# Patient Record
Sex: Male | Born: 1975 | Race: White | Hispanic: No | Marital: Married | Smoking: Never smoker
Health system: Southern US, Community
[De-identification: ages and names within clinical notes are randomized; demographics above are authoritative.]

## PROBLEM LIST (undated history)

## (undated) DIAGNOSIS — I10 Essential (primary) hypertension: Secondary | ICD-10-CM

## (undated) DIAGNOSIS — Z9189 Other specified personal risk factors, not elsewhere classified: Secondary | ICD-10-CM

## (undated) DIAGNOSIS — E785 Hyperlipidemia, unspecified: Secondary | ICD-10-CM

## (undated) DIAGNOSIS — N201 Calculus of ureter: Secondary | ICD-10-CM

## (undated) HISTORY — PX: WISDOM TOOTH EXTRACTION: SHX21

---

## 1999-01-24 ENCOUNTER — Ambulatory Visit (HOSPITAL_COMMUNITY): Admission: RE | Admit: 1999-01-24 | Discharge: 1999-01-24 | Payer: Self-pay | Admitting: Family Medicine

## 1999-01-24 ENCOUNTER — Encounter: Payer: Self-pay | Admitting: Family Medicine

## 1999-10-05 ENCOUNTER — Ambulatory Visit (HOSPITAL_COMMUNITY): Admission: RE | Admit: 1999-10-05 | Discharge: 1999-10-05 | Payer: Self-pay

## 2010-06-02 ENCOUNTER — Other Ambulatory Visit: Payer: Self-pay | Admitting: Family Medicine

## 2010-06-02 DIAGNOSIS — Q248 Other specified congenital malformations of heart: Secondary | ICD-10-CM

## 2010-06-02 DIAGNOSIS — R05 Cough: Secondary | ICD-10-CM

## 2010-06-08 ENCOUNTER — Ambulatory Visit
Admission: RE | Admit: 2010-06-08 | Discharge: 2010-06-08 | Disposition: A | Discharge status: Home / Self Care | Payer: 59 | Source: Ambulatory Visit | Attending: Family Medicine | Admitting: Family Medicine

## 2010-06-08 DIAGNOSIS — Q248 Other specified congenital malformations of heart: Secondary | ICD-10-CM

## 2010-06-08 DIAGNOSIS — R05 Cough: Secondary | ICD-10-CM

## 2010-06-08 MED ORDER — IOHEXOL 300 MG/ML  SOLN
75.0000 mL | Freq: Once | INTRAMUSCULAR | Status: AC | PRN
  Administered 2010-06-08: 75 mL via INTRAVENOUS

## 2014-12-08 ENCOUNTER — Encounter (HOSPITAL_BASED_OUTPATIENT_CLINIC_OR_DEPARTMENT_OTHER): Payer: Self-pay

## 2014-12-08 ENCOUNTER — Emergency Department (HOSPITAL_BASED_OUTPATIENT_CLINIC_OR_DEPARTMENT_OTHER)
Admission: EM | Admit: 2014-12-08 | Discharge: 2014-12-09 | Disposition: A | Discharge status: Home / Self Care | Payer: 59 | Attending: Emergency Medicine | Admitting: Emergency Medicine

## 2014-12-08 ENCOUNTER — Emergency Department (HOSPITAL_BASED_OUTPATIENT_CLINIC_OR_DEPARTMENT_OTHER): Payer: 59

## 2014-12-08 DIAGNOSIS — R109 Unspecified abdominal pain: Secondary | ICD-10-CM

## 2014-12-08 DIAGNOSIS — I10 Essential (primary) hypertension: Secondary | ICD-10-CM | POA: Insufficient documentation

## 2014-12-08 DIAGNOSIS — E7800 Pure hypercholesterolemia, unspecified: Secondary | ICD-10-CM | POA: Insufficient documentation

## 2014-12-08 DIAGNOSIS — Z79899 Other long term (current) drug therapy: Secondary | ICD-10-CM | POA: Diagnosis not present

## 2014-12-08 DIAGNOSIS — N23 Unspecified renal colic: Secondary | ICD-10-CM

## 2014-12-08 HISTORY — DX: Essential (primary) hypertension: I10

## 2014-12-08 LAB — URINALYSIS, ROUTINE W REFLEX MICROSCOPIC
Bilirubin Urine: NEGATIVE
Glucose, UA: NEGATIVE mg/dL
Ketones, ur: NEGATIVE mg/dL
Leukocytes, UA: NEGATIVE
Nitrite: NEGATIVE
Protein, ur: NEGATIVE mg/dL
Specific Gravity, Urine: 1.021 (ref 1.005–1.030)
pH: 7.5 (ref 5.0–8.0)

## 2014-12-08 LAB — URINE MICROSCOPIC-ADD ON

## 2014-12-08 MED ORDER — SODIUM CHLORIDE 0.9 % IV SOLN
INTRAVENOUS | Status: DC
  Administered 2014-12-08: 23:00:00 via INTRAVENOUS

## 2014-12-08 MED ORDER — HYDROMORPHONE HCL 1 MG/ML IJ SOLN
1.0000 mg | Freq: Once | INTRAMUSCULAR | Status: AC
  Administered 2014-12-09: 1 mg via INTRAVENOUS
  Filled 2014-12-08: qty 1

## 2014-12-08 MED ORDER — ONDANSETRON HCL 4 MG/2ML IJ SOLN
4.0000 mg | Freq: Once | INTRAMUSCULAR | Status: AC
  Administered 2014-12-08: 4 mg via INTRAVENOUS
  Filled 2014-12-08: qty 2

## 2014-12-08 NOTE — ED Notes (Signed)
MD at bedside. 

## 2014-12-08 NOTE — ED Notes (Signed)
Right flank pan x 2 days

## 2014-12-08 NOTE — ED Provider Notes (Signed)
CSN: [id number]     Arrival date & time 12/08/14  2249 History  By signing my name below, I, Soijett Blue, attest that this documentation has been prepared under the direction and in the presence of Paula Libra, MD. Electronically Signed: Soijett Blue, ED Scribe. 12/08/2014. 11:17 PM.   Chief Complaint  Patient presents with  . Flank Pain      The history is provided by the patient. No language interpreter was used.    HPI Comments: Devin Horne is a 39 y.o. male with a medical hx of HTN who presents to the Emergency Department complaining of 7/10, waxing and waning, right flank pain x 2 days. He notes that his right flank pain radiates to his RLQ. He denies having a kidney stone in the past. He is having associated symptoms of hematuria and nausea. Pt was seen for his hematuria several months ago and was informed that he didn't have a urinary tract infection and was referred to a urologist whom he has yet to follow up with. He states that he has not tried any medications for the relief for his symptoms. He denies vomiting. He has not had a fever or chills.   Past Medical History  Diagnosis Date  . Hypertension   . High cholesterol    History reviewed. No pertinent past surgical history. No family history on file. Social History  Substance Use Topics  . Smoking status: Never Smoker   . Smokeless tobacco: None  . Alcohol Use: No    Review of Systems  Genitourinary: Positive for flank pain.    A complete 10 system review of systems was obtained and all systems are negative except as noted in the HPI and PMH.   Allergies  Codeine  Home Medications   Prior to Admission medications   Medication Sig Start Date End Date Taking? Authorizing Provider  losartan (COZAAR) 100 MG tablet Take 100 mg by mouth daily.   Yes Historical Provider, MD  metoprolol succinate (TOPROL-XL) 50 MG 24 hr tablet Take 50 mg by mouth daily. Take with or immediately following a meal.   Yes Historical  Provider, MD  rosuvastatin (CRESTOR) 20 MG tablet Take 20 mg by mouth daily.   Yes Historical Provider, MD   BP 175/114 mmHg  Pulse 72  Temp(Src) 98.9 F (37.2 C) (Oral)  Resp 20  Ht  (1.676 m)  Wt 286 lb (129.729 kg)  BMI 46.18 kg/m2  SpO2 99%   Physical Exam General: Well-developed, well-nourished male in no acute distress; appearance consistent with age of record HENT: normocephalic; atraumatic Eyes: pupils equal, round and reactive to light; extraocular muscles intact Neck: supple Heart: regular rate and rhythm Lungs: clear to auscultation bilaterally Abdomen: soft; nondistended; nontender; no masses or hepatosplenomegaly; bowel sounds present GU: No CVA tenderness Extremities: No deformity; full range of motion; pulses normal Neurologic: Awake, alert and oriented; motor function intact in all extremities and symmetric; no facial droop Skin: Warm and dry Psychiatric: Normal mood and affect    ED Course  Procedures (including critical care time) DIAGNOSTIC STUDIES: Oxygen Saturation is 99% on RA, nl by my interpretation.    COORDINATION OF CARE: 11:17 PM Discussed treatment plan with pt at bedside which includes UA, CT renal stone, dilaudid and zofran and pt agreed to plan.   MDM   Nursing notes and vitals signs, including pulse oximetry, reviewed.  Summary of this visit's results, reviewed by myself:  Labs:  Results for orders placed or performed  during the hospital encounter of 12/08/14 (from the past 24 hour(s))  Urinalysis, Routine w reflex microscopic (not at [facility])     Status: Abnormal   Collection Time: 12/08/14 11:00 PM  Result Value Ref Range   Color, Urine YELLOW YELLOW   APPearance CLEAR CLEAR   Specific Gravity, Urine 1.021 1.005 - 1.030   pH 7.5 5.0 - 8.0   Glucose, UA NEGATIVE NEGATIVE mg/dL   Hgb urine dipstick LARGE (A) NEGATIVE   Bilirubin Urine NEGATIVE NEGATIVE   Ketones, ur NEGATIVE NEGATIVE mg/dL   Protein, ur NEGATIVE NEGATIVE  mg/dL   Nitrite NEGATIVE NEGATIVE   Leukocytes, UA NEGATIVE NEGATIVE  Urine microscopic-add on     Status: Abnormal   Collection Time: 12/08/14 11:00 PM  Result Value Ref Range   Squamous Epithelial / LPF 0-5 (A) NONE SEEN   WBC, UA 0-5 0 - 5 WBC/hpf   RBC / HPF TOO NUMEROUS TO COUNT 0 - 5 RBC/hpf   Bacteria, UA RARE (A) NONE SEEN    Imaging Studies: Ct Renal Stone Study  12/09/2014  CLINICAL DATA:  Right flank pain, intermittent for 2 weeks and severe for the past 2 days. Hematuria. EXAM: CT ABDOMEN AND PELVIS WITHOUT CONTRAST TECHNIQUE: Multidetector CT imaging of the abdomen and pelvis was performed following the standard protocol without IV contrast. COMPARISON:  None. FINDINGS: There is an obstructing 5 mm calculus in the proximal right ureter at the low L3 level. There is moderate hydroureter and hydronephrosis. No other urinary calculi are evident. There is no other acute abnormality in the abdomen or pelvis. There are unremarkable unenhanced appearances of the liver, gallbladder, pancreas, spleen and adrenals. The abdominal aorta is normal in caliber. There is no atherosclerotic calcification. There is no adenopathy in the abdomen or pelvis. There are normal appearances of the stomach, small bowel and colon. The appendix is normal. No significant abnormality is evident in the lower chest. No significant musculoskeletal lesion is evident. Small fat containing umbilical hernia is present. IMPRESSION: 1. Obstructing 5 mm proximal right ureteral calculus at the low L3 level. Moderate hydronephrosis. 2. Small fat containing umbilical hernia. Electronically Signed   By: Ellery Plunkaniel R Mitchell M.D.   On: 12/09/2014 00:07   12:44 AM Patient ready to go. Patient advised of CT findings.  I personally performed the services described in this documentation, which was scribed in my presence. The recorded information has been reviewed and is accurate.   Paula LibraJohn Monigue Spraggins, MD 12/09/14 [phone]

## 2014-12-09 MED ORDER — ONDANSETRON HCL 4 MG/2ML IJ SOLN
INTRAMUSCULAR | Status: AC
  Filled 2014-12-09: qty 2

## 2014-12-09 MED ORDER — ONDANSETRON 8 MG PO TBDP: 8.0000 mg | ORAL_TABLET | Freq: Three times a day (TID) | ORAL | Status: DC | PRN

## 2014-12-09 MED ORDER — HYDROMORPHONE HCL 1 MG/ML IJ SOLN: 1.0000 mg | Freq: Once | INTRAMUSCULAR | Status: DC

## 2014-12-09 MED ORDER — OXYCODONE-ACETAMINOPHEN 5-325 MG PO TABS: 1.0000 | ORAL_TABLET | ORAL | Status: DC | PRN

## 2014-12-09 MED ORDER — ONDANSETRON HCL 4 MG/2ML IJ SOLN
4.0000 mg | Freq: Once | INTRAMUSCULAR | Status: AC
  Administered 2014-12-09: 4 mg via INTRAVENOUS

## 2014-12-09 NOTE — Discharge Instructions (Signed)
Flank Pain °Flank pain refers to pain that is located on the side of the body between the upper abdomen and the back. The pain may occur over a short period of time (acute) or may be long-term or reoccurring (chronic). It may be mild or severe. Flank pain can be caused by many things. °CAUSES  °Some of the more common causes of flank pain include: °· Muscle strains.   °· Muscle spasms.   °· A disease of your spine (vertebral disk disease).   °· A lung infection (pneumonia).   °· Fluid around your lungs (pulmonary edema).   °· A kidney infection.   °· Kidney stones.   °· A very painful skin rash caused by the chickenpox virus (shingles).   °· Gallbladder disease.   °HOME CARE INSTRUCTIONS  °Home care will depend on the cause of your pain. In general, °· Rest as directed by your caregiver. °· Drink enough fluids to keep your urine clear or pale yellow. °· Only take over-the-counter or prescription medicines as directed by your caregiver. Some medicines may help relieve the pain. °· Tell your caregiver about any changes in your pain. °· Follow up with your caregiver as directed. °SEEK IMMEDIATE MEDICAL CARE IF:  °· Your pain is not controlled with medicine.   °· You have new or worsening symptoms. °· Your pain increases.   °· You have abdominal pain.   °· You have shortness of breath.   °· You have persistent nausea or vomiting.   °· You have swelling in your abdomen.   °· You feel faint or pass out.   °· You have blood in your urine. °· You have a fever or persistent symptoms for more than 2-3 days. °· You have a fever and your symptoms suddenly get worse. °MAKE SURE YOU:  °· Understand these instructions. °· Will watch your condition. °· Will get help right away if you are not doing well or get worse. °  °This information is not intended to replace advice given to you by your health care provider. Make sure you discuss any questions you have with your health care provider. °  °Document Released: 02/15/2005 Document  Revised: 09/19/2011 Document Reviewed: 08/09/2011 °Elsevier Interactive Patient Education ©2016 Elsevier Inc. ° °

## 2014-12-14 ENCOUNTER — Emergency Department (HOSPITAL_COMMUNITY)
Admission: EM | Admit: 2014-12-14 | Discharge: 2014-12-15 | Disposition: A | Discharge status: Home / Self Care | Payer: 59 | Attending: Emergency Medicine | Admitting: Emergency Medicine

## 2014-12-14 ENCOUNTER — Encounter (HOSPITAL_COMMUNITY): Payer: Self-pay | Admitting: Emergency Medicine

## 2014-12-14 DIAGNOSIS — E7800 Pure hypercholesterolemia, unspecified: Secondary | ICD-10-CM | POA: Diagnosis not present

## 2014-12-14 DIAGNOSIS — N200 Calculus of kidney: Secondary | ICD-10-CM

## 2014-12-14 DIAGNOSIS — I10 Essential (primary) hypertension: Secondary | ICD-10-CM | POA: Insufficient documentation

## 2014-12-14 DIAGNOSIS — R1030 Lower abdominal pain, unspecified: Secondary | ICD-10-CM | POA: Diagnosis present

## 2014-12-14 DIAGNOSIS — Z79899 Other long term (current) drug therapy: Secondary | ICD-10-CM | POA: Insufficient documentation

## 2014-12-14 LAB — URINE MICROSCOPIC-ADD ON

## 2014-12-14 LAB — CBC WITH DIFFERENTIAL/PLATELET
BASOS ABS: 0 10*3/uL (ref 0.0–0.1)
BASOS PCT: 0 %
EOS PCT: 1 %
Eosinophils Absolute: 0.1 10*3/uL (ref 0.0–0.7)
HEMATOCRIT: 42.4 % (ref 39.0–52.0)
Hemoglobin: 14.1 g/dL (ref 13.0–17.0)
LYMPHS ABS: 1.3 10*3/uL (ref 0.7–4.0)
LYMPHS PCT: 12 %
MCH: 32.1 pg (ref 26.0–34.0)
MCHC: 33.3 g/dL (ref 30.0–36.0)
MCV: 96.6 fL (ref 78.0–100.0)
Monocytes Absolute: 0.8 10*3/uL (ref 0.1–1.0)
Monocytes Relative: 7 %
Neutro Abs: 8.9 10*3/uL — ABNORMAL HIGH (ref 1.7–7.7)
Neutrophils Relative %: 80 %
PLATELETS: 275 10*3/uL (ref 150–400)
RBC: 4.39 MIL/uL (ref 4.22–5.81)
RDW: 12.4 % (ref 11.5–15.5)
WBC: 11.2 10*3/uL — ABNORMAL HIGH (ref 4.0–10.5)

## 2014-12-14 LAB — URINALYSIS, ROUTINE W REFLEX MICROSCOPIC
Bilirubin Urine: NEGATIVE
GLUCOSE, UA: NEGATIVE mg/dL
KETONES UR: NEGATIVE mg/dL
LEUKOCYTES UA: NEGATIVE
NITRITE: NEGATIVE
PROTEIN: NEGATIVE mg/dL
Specific Gravity, Urine: 1.026 (ref 1.005–1.030)
pH: 5.5 (ref 5.0–8.0)

## 2014-12-14 LAB — BASIC METABOLIC PANEL
ANION GAP: 10 (ref 5–15)
BUN: 19 mg/dL (ref 6–20)
CO2: 24 mmol/L (ref 22–32)
Calcium: 9.3 mg/dL (ref 8.9–10.3)
Chloride: 102 mmol/L (ref 101–111)
Creatinine, Ser: 1.32 mg/dL — ABNORMAL HIGH (ref 0.61–1.24)
GLUCOSE: 138 mg/dL — AB (ref 65–99)
POTASSIUM: 4.3 mmol/L (ref 3.5–5.1)
Sodium: 136 mmol/L (ref 135–145)

## 2014-12-14 MED ORDER — KETOROLAC TROMETHAMINE 30 MG/ML IJ SOLN
30.0000 mg | Freq: Once | INTRAMUSCULAR | Status: AC
  Administered 2014-12-15: 30 mg via INTRAVENOUS
  Filled 2014-12-14: qty 1

## 2014-12-14 MED ORDER — ONDANSETRON HCL 4 MG/2ML IJ SOLN
4.0000 mg | Freq: Once | INTRAMUSCULAR | Status: AC
  Administered 2014-12-15: 4 mg via INTRAVENOUS
  Filled 2014-12-14: qty 2

## 2014-12-14 MED ORDER — FENTANYL CITRATE (PF) 100 MCG/2ML IJ SOLN: 50.0000 ug | Freq: Once | INTRAMUSCULAR | Status: DC

## 2014-12-14 MED ORDER — ONDANSETRON HCL 4 MG/2ML IJ SOLN
4.0000 mg | Freq: Once | INTRAMUSCULAR | Status: AC
  Administered 2014-12-14: 4 mg via INTRAVENOUS
  Filled 2014-12-14: qty 2

## 2014-12-14 MED ORDER — METOCLOPRAMIDE HCL 5 MG/ML IJ SOLN
10.0000 mg | Freq: Once | INTRAMUSCULAR | Status: AC
  Administered 2014-12-14: 10 mg via INTRAVENOUS
  Filled 2014-12-14: qty 2

## 2014-12-14 MED ORDER — MORPHINE SULFATE (PF) 4 MG/ML IV SOLN
8.0000 mg | Freq: Once | INTRAVENOUS | Status: AC
  Administered 2014-12-14: 8 mg via INTRAVENOUS
  Filled 2014-12-14: qty 2

## 2014-12-14 MED ORDER — FENTANYL CITRATE (PF) 100 MCG/2ML IJ SOLN
50.0000 ug | Freq: Once | INTRAMUSCULAR | Status: AC
Start: 2014-12-14 — End: 2014-12-14
  Administered 2014-12-14: 50 ug via INTRAVENOUS
  Filled 2014-12-14: qty 2

## 2014-12-14 MED ORDER — FENTANYL CITRATE (PF) 100 MCG/2ML IJ SOLN
50.0000 ug | Freq: Once | INTRAMUSCULAR | Status: AC
  Administered 2014-12-14: 50 ug via INTRAVENOUS

## 2014-12-14 NOTE — ED Notes (Signed)
[facility] and [facility] with [facility] kidney stone. Stone has not passed. Was seen at Urology yesterday. Says pain medications (Percocet-allergic to codeine so has been vomiting with medication) have not alleviated pain. Says urine output is "a little diminished." Was also given Zofran 8 mg with no alleviation of nausea. Given Flomax by urologist. Devin Horne first dose today after pain had started-denies alleviation with pain. Says pain is unbearable. No other c/c.

## 2014-12-15 NOTE — ED Notes (Signed)
Known kidney stone, saw Urology MD recently and was sent here for uncontrolled pain and vomiting.

## 2014-12-15 NOTE — ED Provider Notes (Signed)
CSN: [id number]     Arrival date & time 12/14/14  2010 History   First MD Initiated Contact with Patient 12/14/14 2049     No chief complaint on file.    (Consider location/radiation/quality/duration/timing/severity/associated sxs/prior Treatment) Patient is a 39 y.o. male presenting with flank pain. The history is provided by the patient.  Flank Pain This is a recurrent problem. The current episode started 12 to 24 hours ago. The problem occurs constantly. The problem has been gradually worsening. Pertinent negatives include no abdominal pain. Nothing aggravates the symptoms. Nothing relieves the symptoms. Treatments tried: percocet. The treatment provided no relief.    Past Medical History  Diagnosis Date  . Hypertension   . High cholesterol    History reviewed. No pertinent past surgical history. History reviewed. No pertinent family history. Social History  Substance Use Topics  . Smoking status: Never Smoker   . Smokeless tobacco: None  . Alcohol Use: No    Review of Systems  Gastrointestinal: Positive for nausea and vomiting. Negative for abdominal pain.  Genitourinary: Positive for flank pain.  All other systems reviewed and are negative.     Allergies  Codeine; Dilaudid; Percocet; and Avelox  Home Medications   Prior to Admission medications   Medication Sig Start Date End Date Taking? Authorizing Provider  acetaminophen (TYLENOL) 500 MG tablet Take 1,000 mg by mouth every 6 (six) hours as needed for moderate pain.   Yes Historical Provider, MD  losartan (COZAAR) 100 MG tablet Take 100 mg by mouth daily.   Yes Historical Provider, MD  Magnesium Salicylate (DOANS PILLS PO) Take 2 tablets by mouth every 6 (six) hours as needed (pain).   Yes Historical Provider, MD  metoprolol succinate (TOPROL-XL) 50 MG 24 hr tablet Take 50 mg by mouth daily. Take with or immediately following a meal.   Yes Historical Provider, MD  ondansetron (ZOFRAN ODT) 8 MG disintegrating  tablet Take 1 tablet (8 mg total) by mouth every 8 (eight) hours as needed for nausea or vomiting. 12/09/14  Yes John Molpus, MD  oxyCODONE-acetaminophen (PERCOCET) 5-325 MG tablet Take 1-2 tablets by mouth every 4 (four) hours as needed (for pain). 12/09/14  Yes John Molpus, MD  rosuvastatin (CRESTOR) 20 MG tablet Take 20 mg by mouth daily.   Yes Historical Provider, MD  tamsulosin (FLOMAX) 0.4 MG CAPS capsule Take 0.4 mg by mouth daily. 12/14/14  Yes Historical Provider, MD  VENTOLIN HFA 108 (90 BASE) MCG/ACT inhaler Inhale 2 puffs into the lungs every 4 (four) hours. 11/18/14  Yes Historical Provider, MD   BP 154/76 mmHg  Pulse 79  Temp(Src) 97.7 F (36.5 C) (Oral)  Resp 20  SpO2 93% Physical Exam  Constitutional: He is oriented to person, place, and time. He appears well-developed and well-nourished. No distress.  HENT:  Head: Normocephalic and atraumatic.  Eyes: Conjunctivae are normal.  Neck: Neck supple. No tracheal deviation present.  Cardiovascular: Normal rate and regular rhythm.   Pulmonary/Chest: Effort normal. No respiratory distress.  Abdominal: Soft. He exhibits no distension. There is no tenderness. There is CVA tenderness (on right).  Neurological: He is alert and oriented to person, place, and time.  Skin: Skin is warm and dry.  Psychiatric: He has a normal mood and affect.    ED Course  Procedures (including critical care time)  Emergency Focused Ultrasound Exam Limited Retroperitoneal Ultrasound of Kidneys  Performed and interpreted by Dr. Clydene PughKnott Focused abdominal ultrasound with both kidneys imaged in transverse and longitudinal planes in  real-time. Indication: flank pain Findings: only right kidney visualized, no shadowing, + anechoic areas Interpretation: mild right hydronephrosis visualized.  no stones or cysts visualized  Examination severely limited secondary to patient body habitus Images archived electronically  CPT Code: 16109   Labs Review Labs  Reviewed  CBC WITH DIFFERENTIAL/PLATELET - Abnormal; Notable for the following:    WBC 11.2 (*)    Neutro Abs 8.9 (*)    All other components within normal limits  BASIC METABOLIC PANEL - Abnormal; Notable for the following:    Glucose, Bld 138 (*)    Creatinine, Ser 1.32 (*)    All other components within normal limits  URINALYSIS, ROUTINE W REFLEX MICROSCOPIC (NOT AT [facility]) - Abnormal; Notable for the following:    Color, Urine AMBER (*)    APPearance CLOUDY (*)    Hgb urine dipstick LARGE (*)    All other components within normal limits  URINE MICROSCOPIC-ADD ON - Abnormal; Notable for the following:    Squamous Epithelial / LPF 0-5 (*)    Bacteria, UA FEW (*)    All other components within normal limits  URINE CULTURE    Imaging Review No results found. I have personally reviewed and evaluated these images and lab results as part of my medical decision-making.   EKG Interpretation None      MDM   Final diagnoses:  Kidney stone on right side    39 year old male presents with known right-sided 5 mm stone that is obstructing. He had [facility] 6 days ago, has follow-up with urology and at that time had improvement in his pain. Today pain worsened acutely and patient was having ongoing side effects of his Percocet medication. He was given morphine with some relief of his symptoms on arrival. His screening lab work shows no evidence of acute infection, his creatinine is only mildly elevated at 1.32 and he has a unilateral obstruction. Mild persistent hydronephrosis is noted on today's exam but this is limited due to habitus.   After multiple dose of narcotic agents and anti-emetics as well as toradol Pt's pain is adequately controled. Agrees to contact urology in the morning for reassessment as symptoms have increased and stone has not yet passed. Return precautions discussed for new signs of infection, inability to tolerate po, or uncontrolled  pain.    Lyndal Pulley, MD 12/15/14 [phone]

## 2014-12-15 NOTE — Discharge Instructions (Signed)
Kidney Stones °Kidney stones (urolithiasis) are deposits that form inside your kidneys. The intense pain is caused by the stone moving through the urinary tract. When the stone moves, the ureter goes into spasm around the stone. The stone is usually passed in the urine.  °CAUSES  °· A disorder that makes certain neck glands produce too much parathyroid hormone (primary hyperparathyroidism). °· A buildup of uric acid crystals, similar to gout in your joints. °· Narrowing (stricture) of the ureter. °· A kidney obstruction present at birth (congenital obstruction). °· Previous surgery on the kidney or ureters. °· Numerous kidney infections. °SYMPTOMS  °· Feeling sick to your stomach (nauseous). °· Throwing up (vomiting). °· Blood in the urine (hematuria). °· Pain that usually spreads (radiates) to the groin. °· Frequency or urgency of urination. °DIAGNOSIS  °· Taking a history and physical exam. °· Blood or urine tests. °· CT scan. °· Occasionally, an examination of the inside of the urinary bladder (cystoscopy) is performed. °TREATMENT  °· Observation. °· Increasing your fluid intake. °· Extracorporeal shock wave lithotripsy--This is a noninvasive procedure that uses shock waves to break up kidney stones. °· Surgery may be needed if you have severe pain or persistent obstruction. There are various surgical procedures. Most of the procedures are performed with the use of small instruments. Only small incisions are needed to accommodate these instruments, so recovery time is minimized. °The size, location, and chemical composition are all important variables that will determine the proper choice of action for you. Talk to your health care provider to better understand your situation so that you will minimize the risk of injury to yourself and your kidney.  °HOME CARE INSTRUCTIONS  °· Drink enough water and fluids to keep your urine clear or pale yellow. This will help you to pass the stone or stone fragments. °· Strain  all urine through the provided strainer. Keep all particulate matter and stones for your health care provider to see. The stone causing the pain may be as small as a grain of salt. It is very important to use the strainer each and every time you pass your urine. The collection of your stone will allow your health care provider to analyze it and verify that a stone has actually passed. The stone analysis will often identify what you can do to reduce the incidence of recurrences. °· Only take over-the-counter or prescription medicines for pain, discomfort, or fever as directed by your health care provider. °· Keep all follow-up visits as told by your health care provider. This is important. °· Get follow-up X-rays if required. The absence of pain does not always mean that the stone has passed. It may have only stopped moving. If the urine remains completely obstructed, it can cause loss of kidney function or even complete destruction of the kidney. It is your responsibility to make sure X-rays and follow-ups are completed. Ultrasounds of the kidney can show blockages and the status of the kidney. Ultrasounds are not associated with any radiation and can be performed easily in a matter of minutes. °· Make changes to your daily diet as told by your health care provider. You may be told to: °¨ Limit the amount of salt that you eat. °¨ Eat 5 or more servings of fruits and vegetables each day. °¨ Limit the amount of meat, poultry, fish, and eggs that you eat. °· Collect a 24-hour urine sample as told by your health care provider. You may need to collect another urine sample every 6-12   months. °SEEK MEDICAL CARE IF: °· You experience pain that is progressive and unresponsive to any pain medicine you have been prescribed. °SEEK IMMEDIATE MEDICAL CARE IF:  °· Pain cannot be controlled with the prescribed medicine. °· You have a fever or shaking chills. °· The severity or intensity of pain increases over 18 hours and is not  relieved by pain medicine. °· You develop a new onset of abdominal pain. °· You feel faint or pass out. °· You are unable to urinate. °  °This information is not intended to replace advice given to you by your health care provider. Make sure you discuss any questions you have with your health care provider. °  °Document Released: 12/25/2004 Document Revised: 09/15/2014 Document Reviewed: 05/28/2012 °Elsevier Interactive Patient Education ©2016 Elsevier Inc. ° °

## 2014-12-16 LAB — URINE CULTURE: Culture: NO GROWTH

## 2015-03-09 ENCOUNTER — Other Ambulatory Visit: Payer: Self-pay | Admitting: Urology

## 2015-03-17 ENCOUNTER — Encounter (HOSPITAL_BASED_OUTPATIENT_CLINIC_OR_DEPARTMENT_OTHER): Payer: Self-pay | Admitting: *Deleted

## 2015-03-17 NOTE — Progress Notes (Signed)
   03/17/15 1430  OBSTRUCTIVE SLEEP APNEA  Have you ever been diagnosed with sleep apnea through a sleep study? No  Do you snore loudly (loud enough to be heard through closed doors)?  1  Do you often feel tired, fatigued, or sleepy during the daytime (such as falling asleep during driving or talking to someone)? 0  Has anyone observed you stop breathing during your sleep? 0  Do you have, or are you being treated for high blood pressure? 1  BMI more than 35 kg/m2? 1  Age > 50 (1-yes) 0  Neck circumference greater than:Male 16 inches or larger, Male 17inches or larger? 1  Male Gender (Yes=1) 1  Obstructive Sleep Apnea Score 5  Score 5 or greater  Results sent to PCP

## 2015-03-17 NOTE — Progress Notes (Signed)
NPO AFTER MN.  ARRIVE AT 0930.  NEEDS ISTAT AND EKG.  WILL TAKE AM MEDS AND ZANTAC DOS W/ SIPS OF WATER.

## 2015-03-23 ENCOUNTER — Ambulatory Visit (HOSPITAL_BASED_OUTPATIENT_CLINIC_OR_DEPARTMENT_OTHER)
Admission: RE | Admit: 2015-03-23 | Discharge: 2015-03-23 | Disposition: A | Discharge status: Home / Self Care | Payer: BLUE CROSS/BLUE SHIELD | Source: Ambulatory Visit | Attending: Urology | Admitting: Urology

## 2015-03-23 ENCOUNTER — Encounter (HOSPITAL_BASED_OUTPATIENT_CLINIC_OR_DEPARTMENT_OTHER)
Admission: RE | Disposition: A | Discharge status: Home / Self Care | Payer: Self-pay | Source: Ambulatory Visit | Attending: Urology

## 2015-03-23 ENCOUNTER — Ambulatory Visit (HOSPITAL_BASED_OUTPATIENT_CLINIC_OR_DEPARTMENT_OTHER): Payer: BLUE CROSS/BLUE SHIELD | Admitting: Anesthesiology

## 2015-03-23 ENCOUNTER — Other Ambulatory Visit: Payer: Self-pay

## 2015-03-23 ENCOUNTER — Encounter (HOSPITAL_BASED_OUTPATIENT_CLINIC_OR_DEPARTMENT_OTHER): Payer: Self-pay | Admitting: Anesthesiology

## 2015-03-23 DIAGNOSIS — I10 Essential (primary) hypertension: Secondary | ICD-10-CM | POA: Diagnosis not present

## 2015-03-23 DIAGNOSIS — Z79899 Other long term (current) drug therapy: Secondary | ICD-10-CM | POA: Diagnosis not present

## 2015-03-23 DIAGNOSIS — Z6841 Body Mass Index (BMI) 40.0 and over, adult: Secondary | ICD-10-CM | POA: Insufficient documentation

## 2015-03-23 DIAGNOSIS — N201 Calculus of ureter: Secondary | ICD-10-CM | POA: Diagnosis present

## 2015-03-23 HISTORY — DX: Calculus of ureter: N20.1

## 2015-03-23 HISTORY — PX: CYSTOSCOPY/RETROGRADE/URETEROSCOPY/STONE EXTRACTION WITH BASKET: SHX5317

## 2015-03-23 HISTORY — PX: HOLMIUM LASER APPLICATION: SHX5852

## 2015-03-23 HISTORY — DX: Other specified personal risk factors, not elsewhere classified: Z91.89

## 2015-03-23 HISTORY — PX: CYSTOSCOPY WITH STENT PLACEMENT: SHX5790

## 2015-03-23 HISTORY — DX: Hyperlipidemia, unspecified: E78.5

## 2015-03-23 LAB — POCT I-STAT, CHEM 8
BUN: 16 mg/dL (ref 6–20)
CHLORIDE: 109 mmol/L (ref 101–111)
Calcium, Ion: 1.14 mmol/L (ref 1.12–1.23)
Creatinine, Ser: 1.1 mg/dL (ref 0.61–1.24)
Glucose, Bld: 100 mg/dL — ABNORMAL HIGH (ref 65–99)
HEMATOCRIT: 45 % (ref 39.0–52.0)
HEMOGLOBIN: 15.3 g/dL (ref 13.0–17.0)
POTASSIUM: 4.5 mmol/L (ref 3.5–5.1)
SODIUM: 142 mmol/L (ref 135–145)
TCO2: 21 mmol/L (ref 0–100)

## 2015-03-23 SURGERY — CYSTOSCOPY, WITH CALCULUS REMOVAL USING BASKET
Anesthesia: General | Laterality: Right

## 2015-03-23 MED ORDER — FENTANYL CITRATE (PF) 100 MCG/2ML IJ SOLN
INTRAMUSCULAR | Status: AC
  Filled 2015-03-23: qty 2

## 2015-03-23 MED ORDER — URELLE 81 MG PO TABS
1.0000 | ORAL_TABLET | Freq: Four times a day (QID) | ORAL | Status: DC
  Filled 2015-03-23: qty 1

## 2015-03-23 MED ORDER — EPHEDRINE SULFATE 50 MG/ML IJ SOLN
INTRAMUSCULAR | Status: DC | PRN
  Administered 2015-03-23: 10 mg via INTRAVENOUS
  Administered 2015-03-23: 15 mg via INTRAVENOUS
  Administered 2015-03-23: 10 mg via INTRAVENOUS
  Administered 2015-03-23: 15 mg via INTRAVENOUS

## 2015-03-23 MED ORDER — LACTATED RINGERS IV SOLN
INTRAVENOUS | Status: DC
  Administered 2015-03-23 (×2): via INTRAVENOUS
  Filled 2015-03-23: qty 1000

## 2015-03-23 MED ORDER — OXYCODONE HCL 5 MG/5ML PO SOLN
5.0000 mg | Freq: Once | ORAL | Status: DC | PRN
  Filled 2015-03-23: qty 5

## 2015-03-23 MED ORDER — KETOROLAC TROMETHAMINE 30 MG/ML IJ SOLN
INTRAMUSCULAR | Status: AC
  Filled 2015-03-23: qty 1

## 2015-03-23 MED ORDER — DEXAMETHASONE SODIUM PHOSPHATE 4 MG/ML IJ SOLN
INTRAMUSCULAR | Status: DC | PRN
  Administered 2015-03-23: 10 mg via INTRAVENOUS

## 2015-03-23 MED ORDER — CEFAZOLIN SODIUM-DEXTROSE 2-3 GM-% IV SOLR
INTRAVENOUS | Status: AC
  Filled 2015-03-23: qty 50

## 2015-03-23 MED ORDER — FENTANYL CITRATE (PF) 100 MCG/2ML IJ SOLN
INTRAMUSCULAR | Status: DC | PRN
  Administered 2015-03-23 (×2): 50 ug via INTRAVENOUS

## 2015-03-23 MED ORDER — EPHEDRINE SULFATE 50 MG/ML IJ SOLN
INTRAMUSCULAR | Status: AC
  Filled 2015-03-23: qty 1

## 2015-03-23 MED ORDER — PROPOFOL 10 MG/ML IV BOLUS
INTRAVENOUS | Status: DC | PRN
  Administered 2015-03-23: 300 mg via INTRAVENOUS
  Administered 2015-03-23: 100 mg via INTRAVENOUS

## 2015-03-23 MED ORDER — DEXAMETHASONE SODIUM PHOSPHATE 10 MG/ML IJ SOLN
INTRAMUSCULAR | Status: AC
  Filled 2015-03-23: qty 1

## 2015-03-23 MED ORDER — METOCLOPRAMIDE HCL 5 MG/ML IJ SOLN
INTRAMUSCULAR | Status: AC
  Filled 2015-03-23: qty 2

## 2015-03-23 MED ORDER — HYDROMORPHONE HCL 1 MG/ML IJ SOLN
0.2500 mg | INTRAMUSCULAR | Status: DC | PRN
  Filled 2015-03-23: qty 1

## 2015-03-23 MED ORDER — PHENYLEPHRINE HCL 10 MG/ML IJ SOLN
INTRAMUSCULAR | Status: DC | PRN
  Administered 2015-03-23: 80 ug via INTRAVENOUS
  Administered 2015-03-23: 40 ug via INTRAVENOUS
  Administered 2015-03-23: 80 ug via INTRAVENOUS

## 2015-03-23 MED ORDER — HYDROCODONE-ACETAMINOPHEN 5-325 MG PO TABS: 1.0000 | ORAL_TABLET | Freq: Four times a day (QID) | ORAL | Status: AC | PRN

## 2015-03-23 MED ORDER — MEPERIDINE HCL 25 MG/ML IJ SOLN
6.2500 mg | INTRAMUSCULAR | Status: DC | PRN
  Filled 2015-03-23: qty 1

## 2015-03-23 MED ORDER — LIDOCAINE HCL (CARDIAC) 20 MG/ML IV SOLN
INTRAVENOUS | Status: DC | PRN
  Administered 2015-03-23: 100 mg via INTRAVENOUS

## 2015-03-23 MED ORDER — MIDAZOLAM HCL 5 MG/5ML IJ SOLN
INTRAMUSCULAR | Status: DC | PRN
  Administered 2015-03-23: 2 mg via INTRAVENOUS

## 2015-03-23 MED ORDER — IOHEXOL 350 MG/ML SOLN
INTRAVENOUS | Status: DC | PRN
  Administered 2015-03-23: 1 mL via URETHRAL

## 2015-03-23 MED ORDER — HYDROCODONE-ACETAMINOPHEN 5-325 MG PO TABS
ORAL_TABLET | ORAL | Status: AC
  Filled 2015-03-23: qty 2

## 2015-03-23 MED ORDER — URIBEL 118 MG PO CAPS: 1.0000 | ORAL_CAPSULE | Freq: Three times a day (TID) | ORAL | Status: AC | PRN

## 2015-03-23 MED ORDER — ONDANSETRON HCL 4 MG/2ML IJ SOLN
INTRAMUSCULAR | Status: DC | PRN
  Administered 2015-03-23: 4 mg via INTRAVENOUS

## 2015-03-23 MED ORDER — PROPOFOL 10 MG/ML IV BOLUS
INTRAVENOUS | Status: AC
  Filled 2015-03-23: qty 20

## 2015-03-23 MED ORDER — DEXTROSE 5 % IV SOLN
3.0000 g | INTRAVENOUS | Status: AC
  Administered 2015-03-23: 3 g via INTRAVENOUS
  Filled 2015-03-23: qty 3000

## 2015-03-23 MED ORDER — MIDAZOLAM HCL 2 MG/2ML IJ SOLN
INTRAMUSCULAR | Status: AC
  Filled 2015-03-23: qty 2

## 2015-03-23 MED ORDER — HYDROCODONE-ACETAMINOPHEN 5-325 MG PO TABS
2.0000 | ORAL_TABLET | Freq: Four times a day (QID) | ORAL | Status: DC | PRN
  Administered 2015-03-23: 2 via ORAL
  Filled 2015-03-23: qty 2

## 2015-03-23 MED ORDER — ONDANSETRON HCL 4 MG/2ML IJ SOLN
INTRAMUSCULAR | Status: AC
  Filled 2015-03-23: qty 2

## 2015-03-23 MED ORDER — SODIUM CHLORIDE 0.9 % IR SOLN
Status: DC | PRN
  Administered 2015-03-23 (×3): 1000 mL via INTRAVESICAL

## 2015-03-23 MED ORDER — URELLE 81 MG PO TABS
ORAL_TABLET | ORAL | Status: AC
  Filled 2015-03-23: qty 1

## 2015-03-23 MED ORDER — PHENYLEPHRINE 40 MCG/ML (10ML) SYRINGE FOR IV PUSH (FOR BLOOD PRESSURE SUPPORT)
PREFILLED_SYRINGE | INTRAVENOUS | Status: AC
  Filled 2015-03-23: qty 10

## 2015-03-23 MED ORDER — CEFAZOLIN SODIUM 1-5 GM-% IV SOLN
1.0000 g | INTRAVENOUS | Status: DC
  Filled 2015-03-23: qty 50

## 2015-03-23 MED ORDER — SCOPOLAMINE 1 MG/3DAYS TD PT72
1.0000 | MEDICATED_PATCH | TRANSDERMAL | Status: DC
  Administered 2015-03-23: 1.5 mg via TRANSDERMAL
  Filled 2015-03-23: qty 1

## 2015-03-23 MED ORDER — METOCLOPRAMIDE HCL 5 MG/ML IJ SOLN
INTRAMUSCULAR | Status: DC | PRN
  Administered 2015-03-23: 10 mg via INTRAVENOUS

## 2015-03-23 MED ORDER — SCOPOLAMINE 1 MG/3DAYS TD PT72
MEDICATED_PATCH | TRANSDERMAL | Status: AC
  Filled 2015-03-23: qty 1

## 2015-03-23 MED ORDER — CEFAZOLIN SODIUM 1-5 GM-% IV SOLN
INTRAVENOUS | Status: AC
  Filled 2015-03-23: qty 50

## 2015-03-23 MED ORDER — KETOROLAC TROMETHAMINE 30 MG/ML IJ SOLN
INTRAMUSCULAR | Status: DC | PRN
  Administered 2015-03-23: 30 mg via INTRAVENOUS

## 2015-03-23 MED ORDER — LIDOCAINE HCL (CARDIAC) 20 MG/ML IV SOLN
INTRAVENOUS | Status: AC
  Filled 2015-03-23: qty 5

## 2015-03-23 MED ORDER — OXYCODONE HCL 5 MG PO TABS
5.0000 mg | ORAL_TABLET | Freq: Once | ORAL | Status: DC | PRN
  Filled 2015-03-23: qty 1

## 2015-03-23 SURGICAL SUPPLY — 42 items
ADAPTER CATH URET PLST 4-6FR (CATHETERS) IMPLANT
ADPR CATH URET STRL DISP 4-6FR (CATHETERS)
APL SKNCLS STERI-STRIP NONHPOA (GAUZE/BANDAGES/DRESSINGS)
BAG DRAIN URO-CYSTO SKYTR STRL (DRAIN) ×3 IMPLANT
BAG DRN UROCATH (DRAIN) ×1
BASKET DAKOTA 1.9FR 11X120 (BASKET) ×3 IMPLANT
BASKET LASER NITINOL 1.9FR (BASKET) IMPLANT
BASKET STNLS GEMINI 4WIRE 3FR (BASKET) IMPLANT
BASKET ZERO TIP NITINOL 2.4FR (BASKET) IMPLANT
BENZOIN TINCTURE PRP APPL 2/3 (GAUZE/BANDAGES/DRESSINGS) IMPLANT
BSKT STON RTRVL 120 1.9FR (BASKET)
BSKT STON RTRVL GEM 120X11 3FR (BASKET)
BSKT STON RTRVL ZERO TP 2.4FR (BASKET)
CATH INTERMIT  6FR 70CM (CATHETERS) IMPLANT
CATH URET 5FR 28IN CONE TIP (BALLOONS)
CATH URET 5FR 28IN OPEN ENDED (CATHETERS) ×3 IMPLANT
CATH URET 5FR 70CM CONE TIP (BALLOONS) IMPLANT
CLOTH BEACON ORANGE TIMEOUT ST (SAFETY) ×3 IMPLANT
DRSG TEGADERM 2-3/8X2-3/4 SM (GAUZE/BANDAGES/DRESSINGS) IMPLANT
FIBER LASER FLEXIVA 365 (UROLOGICAL SUPPLIES) ×3 IMPLANT
FIBER LASER TRAC TIP (UROLOGICAL SUPPLIES) IMPLANT
GLOVE BIO SURGEON STRL SZ7.5 (GLOVE) ×3 IMPLANT
GOWN STRL REUS W/ TWL XL LVL3 (GOWN DISPOSABLE) ×1 IMPLANT
GOWN STRL REUS W/TWL XL LVL3 (GOWN DISPOSABLE) ×3
GUIDEWIRE 0.038 PTFE COATED (WIRE) IMPLANT
GUIDEWIRE ANG ZIPWIRE 038X150 (WIRE) IMPLANT
GUIDEWIRE STR DUAL SENSOR (WIRE) ×3 IMPLANT
IV NS 1000ML (IV SOLUTION) ×12
IV NS 1000ML BAXH (IV SOLUTION) ×4 IMPLANT
KIT BALLIN UROMAX 15FX10 (LABEL) IMPLANT
KIT BALLN UROMAX 15FX4 (MISCELLANEOUS) IMPLANT
KIT BALLN UROMAX 26 75X4 (MISCELLANEOUS)
KIT ROOM TURNOVER WOR (KITS) ×3 IMPLANT
MANIFOLD NEPTUNE II (INSTRUMENTS) ×3 IMPLANT
NS IRRIG 500ML POUR BTL (IV SOLUTION) IMPLANT
PACK CYSTO (CUSTOM PROCEDURE TRAY) ×3 IMPLANT
SET HIGH PRES BAL DIL (LABEL)
SHEATH ACCESS URETERAL 38CM (SHEATH) ×3 IMPLANT
STENT URET 6FRX24 CONTOUR (STENTS) ×3 IMPLANT
TUBE CONNECTING 12'X1/4 (SUCTIONS) ×1
TUBE CONNECTING 12X1/4 (SUCTIONS) ×2 IMPLANT
TUBING TUR DISP (UROLOGICAL SUPPLIES) ×3 IMPLANT

## 2015-03-23 NOTE — H&P (Signed)
   Reason for visit: Cystoscopy/right rpg/urteroscopy for right ureteral calculus  History of Present Illness Presents for a 1 week follow-up after being evaluated last week for a right ureteral calculus that has not passed. He has been intermittently symptomatic since December when I saw him initially.    He is remained fairly asymptomatic when compared to last week's office visit when he was complaining of intermittent right-sided lower quadrant abdominal pain consistent with renal colic. He remains on tamsulosin he denies any bothersome lower urinary tract symptoms including dysuria, increased frequency/urgency, or gross hematuria. He has remained afebrile. He denies the passage of the stone.   Vitals Vital Signs [Data Includes: Last 1 Day]  Recorded: 27Feb2017 02:05PM  Blood Pressure: 131 / 85 Temperature: 97.3 F Heart Rate: 85  Physical Exam Constitutional: Well nourished and well developed . No acute distress.  Abdomen: The abdomen is obese. The abdomen is soft and nontender. No masses are palpated. No CVA tenderness. No hernias are palpable. No hepatosplenomegaly noted.  Resp: Nl effort Cardiac: RRR  Results/Data Urine [Data Includes: Last 1 Day]   27Feb2017 COLOR YELLOW  APPEARANCE CLOUDY  SPECIFIC GRAVITY 1.030  pH 5.5  GLUCOSE NEGATIVE  BILIRUBIN NEGATIVE  KETONE TRACE  BLOOD NEGATIVE  PROTEIN NEGATIVE  NITRITE NEGATIVE  LEUKOCYTE ESTERASE NEGATIVE  SQUAMOUS EPITHELIAL/HPF 0-5 HPF WBC NONE SEEN WBC/HPF RBC NONE SEEN RBC/HPF BACTERIA NONE SEEN HPF CRYSTALS See Below HPF CASTS NONE SEEN LPF Other   Yeast NONE SEEN HPF  The following images/tracing/specimen were independently visualized:  KUB: stone in the right distal ureter has not descended much if any at all in the week since last being evaluated. I measure it 7.4 mm today across. Remaining renal shadows appear clear.  The following clinical lab reports were reviewed:  UA without hematuria or pyuria.  PVR:  Ultrasound PVR 13 ml.    Assessment Assessed  1. Ureteral calculus (N20.1)  Plan Health Maintenance  1. UA With REFLEX; [Do Not Release]; Status:Complete;   Done: 27Feb2017 01:40PM Renal colic  2. PVR U/S; Status:Canceled - Date of Service;  Ureteral calculus  3. Follow-up Schedule Surgery Office  Follow-up  Status: Complete  Done: 27Feb2017 4. KUB; Status:Complete;   Done: 27Feb2017 01:44PM  I will send a task to his urologist and plan on scheduling him for ureteroscopy in the next few weeks.   Continue tamsulosin  Continue Sprixx when necessary   Discussion/Summary He did not have any hydronephrosis on his last CT scan performed last week but he has been affected by this calculus for 2-3 months at this time so feel definitive intervention is called for. Noting its location in the distal ureter, I feel that lithotripsy may not be the best option with only a 60-70% success rate. My recommendation is ureteroscopy and I described the procedure for him. I described the risks which include heart attack, stroke, pulmonary embolus, death, bleeding, infection, damage to contiguous structures, positioning injury, ureteral stricture, ureteral avulsion, ureteral injury, need for ureteral stent, inability to perform ureteroscopy, need for an interval procedure, inability to clear stone burden, stent discomfort and pain. The patient expresses understanding. I answered all questions to the best of my ability. He would like to proceed.      Signatures Electronically signed by : Anne FuLarry Gibson, Dyann RuddleANP-C; Mar 07 2015  2:52PM EST

## 2015-03-23 NOTE — Anesthesia Procedure Notes (Signed)
Procedure Name: LMA Insertion Date/Time: 03/23/2015 11:36 AM Performed by: Jessica PriestBEESON, Kimerly Rowand C Pre-anesthesia Checklist: Patient identified, Emergency Drugs available, Suction available and Patient being monitored Patient Re-evaluated:Patient Re-evaluated prior to inductionOxygen Delivery Method: Circle System Utilized Preoxygenation: Pre-oxygenation with 100% oxygen Intubation Type: IV induction Ventilation: Mask ventilation without difficulty LMA: LMA inserted LMA Size: 5.0 Number of attempts: 1 Airway Equipment and Method: Bite block Placement Confirmation: positive ETCO2 Tube secured with: Tape Dental Injury: Teeth and Oropharynx as per pre-operative assessment

## 2015-03-23 NOTE — Anesthesia Postprocedure Evaluation (Signed)
Anesthesia Post Note  Patient: Devin Horne  Procedure(s) Performed: Procedure(s) (LRB): CYSTOSCOPY/RETROGRADE/URETEROSCOPY/STONE EXTRACTION WITH BASKET (Right) CYSTOSCOPY WITH STENT PLACEMENT (Right) HOLMIUM LASER APPLICATION (Right)  Patient location during evaluation: PACU Anesthesia Type: General Level of consciousness: awake and alert Pain management: pain level controlled Vital Signs Assessment: post-procedure vital signs reviewed and stable Respiratory status: spontaneous breathing, nonlabored ventilation and respiratory function stable Cardiovascular status: blood pressure returned to baseline and stable Postop Assessment: no signs of nausea or vomiting Anesthetic complications: no    Last Vitals:  Filed Vitals:   03/23/15 1230 03/23/15 1245  BP: 124/80 126/78  Pulse: 87 85  Temp:    Resp: 12 20    Last Pain: There were no vitals filed for this visit.               Devin Horne,Devin Horne

## 2015-03-23 NOTE — Op Note (Signed)
Preoperative diagnosis: right ureteral calculus  Postoperative diagnosis: right ureteral calculus  Procedure:  1. Cystoscopy 2. right ureteroscopy and stone removal 3. Ureteroscopic laser lithotripsy 4. right ureteral stent placement ([id number]) 24cm 5. right retrograde pyelography with interpretation  Surgeon: Valetta Fulleravid S. Chera Slivka, MD  Anesthesia: General  Complications: None  Intraoperative findings: right retrograde pyelography demonstrated a filling defect within the right ureter consistent with the patient's known calculus without other abnormalities.  EBL: Minimal  Specimens: 1. right ureteral calculus  Disposition of specimens: [facility] for stone analysis  Indication: Devin Horne is a 40 y.o.   patient with urolithiasis. After reviewing the management options for treatment, the patient elected to proceed with the above surgical procedure(s). We have discussed the potential benefits and risks of the procedure, side effects of the proposed treatment, the likelihood of the patient achieving the goals of the procedure, and any potential problems that might occur during the procedure or recuperation. Informed consent has been obtained.  Description of procedure:  The patient was taken to the operating room and general anesthesia was induced.  The patient was placed in the dorsal lithotomy position, prepped and draped in the usual sterile fashion, and preoperative antibiotics were administered. A preoperative time-out was performed.   Cystourethroscopy was performed.  The patient's urethra was examined and was normal. The bladder was then systematically examined in its entirety. There was no evidence for any bladder tumors, stones, or other mucosal pathology.    Attention then turned to the right ureteral orifice and a ureteral catheter was used to intubate the ureteral orifice.  Omnipaque contrast was injected through the ureteral catheter and a retrograde pyelogram was  performed with findings as dictated above.  A 0.38 sensor guidewire was then advanced up the right ureter into the renal pelvis under fluoroscopic guidance. The 6 Fr semirigid ureteroscope was then advanced into the ureter next to the guidewire and the calculus was identified.   The stone was then fragmented with the 365 micron holmium laser fiber on a setting of 0.8J and frequency of 5 Hz.   All stones were then removed from the ureter with a zero tip nitinol basket.  Reinspection of the ureter revealed no remaining visible stones or fragments.   The wire was then backloaded through the cystoscope and a ureteral stent was advance over the wire using Seldinger technique.  The stent was positioned appropriately under fluoroscopic and cystoscopic guidance.  The wire was then removed with an adequate stent curl noted in the renal pelvis as well as in the bladder.  The bladder was then emptied and the procedure ended.  The patient appeared to tolerate the procedure well and without complications.  The patient was able to be awakened and transferred to the recovery unit in satisfactory condition.

## 2015-03-23 NOTE — Transfer of Care (Signed)
  Last Vitals:  Filed Vitals:   03/23/15 0927  BP: 160/90  Pulse: 84  Temp: 36.6 C  Resp: 18    Immediate Anesthesia Transfer of Care Note  Patient: Elta Guadeloupeavid A Preisler  Procedure(s) Performed: Procedure(s) (LRB): CYSTOSCOPY/RETROGRADE/URETEROSCOPY/STONE EXTRACTION WITH BASKET (Right) CYSTOSCOPY WITH STENT PLACEMENT (Right) HOLMIUM LASER APPLICATION (Right)  Patient Location: PACU  Anesthesia Type: General  Level of Consciousness: awake, alert  and oriented  Airway & Oxygen Therapy: Patient Spontanous Breathing and Patient connected to face mask oxygen  Post-op Assessment: Report given to PACU RN and Post -op Vital signs reviewed and stable  Post vital signs: Reviewed and stable  Complications: No apparent anesthesia complications

## 2015-03-23 NOTE — Discharge Instructions (Addendum)
[facility] [phone] Post Ureteroscopy With or Without Stent Instructions  Definitions:  Ureter: The duct that transports urine from the kidney to the bladder. Stent:   A plastic hollow tube that is placed into the ureter, from the kidney to the                 bladder to prevent the ureter from swelling shut.  GENERAL INSTRUCTIONS:  Despite the fact that no skin incisions were used, the area around the ureter and bladder is raw and irritated. The stent is a foreign body which will further irritate the bladder wall. This irritation is manifested by increased frequency of urination, both day and night, and by an increase in the urge to urinate. In some, the urge to urinate is present almost always. Sometimes the urge is strong enough that you may not be able to stop yourself from urinating. The only real cure is to remove the stent and then give time for the bladder wall to heal which can't be done until the danger of the ureter swelling shut has passed, which varies.  You may see some blood in your urine while the stent is in place and a few days afterwards. Do not be alarmed, even if the urine was clear for a while. Get off your feet and drink lots of fluids until clearing occurs. If you start to pass clots or don't improve, call us.  DIET: You may return to your normal diet immediately. Because of the raw surface of your bladder, alcohol, spicy foods, acid type foods and drinks with caffeine may cause irritation or frequency and should be used in moderation. To keep your urine flowing freely and to avoid constipation, drink plenty of fluids during the day ( 8-10 glasses ). Tip: Avoid cranberry juice because it is very acidic.  ACTIVITY: Your physical activity doesn't need to be restricted. However, if you are very active, you may see some blood in your urine. We suggest that you reduce your activity under these circumstances until the bleeding has stopped.  BOWELS: It is  important to keep your bowels regular during the postoperative period. Straining with bowel movements can cause bleeding. A bowel movement every other day is reasonable. Use a mild laxative if needed, such as Milk of Magnesia 2-3 tablespoons, or 2 Dulcolax tablets. Call if you continue to have problems. If you have been taking narcotics for pain, before, during or after your surgery, you may be constipated. Take a laxative if necessary.   MEDICATION: You should resume your pre-surgery medications unless told not to. In addition you will often be given an antibiotic to prevent infection. These should be taken as prescribed until the bottles are finished unless you are having an unusual reaction to one of the drugs.  PROBLEMS YOU SHOULD REPORT TO US:  Fevers over 100.5 Fahrenheit.  Heavy bleeding, or clots ( See above notes about blood in urine ).  Inability to urinate.  Drug reactions ( hives, rash, nausea, vomiting, diarrhea ).  Severe burning or pain with urination that is not improving.  FOLLOW-UP: You will need a follow-up appointment to monitor your progress. Call for this appointment at the number listed above. Usually the first appointment will be about three to fourteen days after your surgery.  We will call about an appointment next Monday to remove the stent   Post Anesthesia Home Care Instructions  Activity: Get plenty of rest for the remainder of the day. A responsible adult should  stay with you for 24 hours following the procedure.  For the next 24 hours, DO NOT: -Drive a car -Paediatric nurse -Drink alcoholic beverages -Take any medication unless instructed by your physician -Make any legal decisions or sign important papers.  Meals: Start with liquid foods such as gelatin or soup. Progress to regular foods as tolerated. Avoid greasy, spicy, heavy foods. If nausea and/or vomiting occur, drink only clear liquids until the nausea and/or vomiting subsides. Call your  physician if vomiting continues.  Special Instructions/Symptoms: Your throat may feel dry or sore from the anesthesia or the breathing tube placed in your throat during surgery. If this causes discomfort, gargle with warm salt water. The discomfort should disappear within 24 hours.  If you had a scopolamine patch placed behind your ear for the management of post- operative nausea and/or vomiting:  1. The medication in the patch is effective for 72 hours, after which it should be removed.  Wrap patch in a tissue and discard in the trash. Wash hands thoroughly with soap and water. 2. You may remove the patch earlier than 72 hours if you experience unpleasant side effects which may include dry mouth, dizziness or visual disturbances. 3. Avoid touching the patch. Wash your hands with soap and water after contact with the patch.

## 2015-03-23 NOTE — Anesthesia Preprocedure Evaluation (Signed)
Anesthesia Evaluation  Patient identified by MRN, date of birth, ID band Patient awake    Reviewed: Allergy & Precautions, NPO status , Patient's Chart, lab work & pertinent test results, reviewed documented beta blocker date and time   Airway Mallampati: I  TM Distance: >3 FB Neck ROM: Full    Dental  (+) Teeth Intact, Dental Advisory Given   Pulmonary    breath sounds clear to auscultation       Cardiovascular hypertension, Pt. on medications and Pt. on home beta blockers  Rhythm:Regular Rate:Normal     Neuro/Psych    GI/Hepatic   Endo/Other  Morbid obesity  Renal/GU      Musculoskeletal   Abdominal   Peds  Hematology   Anesthesia Other Findings   Reproductive/Obstetrics                             Anesthesia Physical Anesthesia Plan  ASA: III  Anesthesia Plan: General   Post-op Pain Management:    Induction: Intravenous  Airway Management Planned: LMA  Additional Equipment:   Intra-op Plan:   Post-operative Plan: Extubation in OR  Informed Consent: I have reviewed the patients History and Physical, chart, labs and discussed the procedure including the risks, benefits and alternatives for the proposed anesthesia with the patient or authorized representative who has indicated his/her understanding and acceptance.   Dental advisory given  Plan Discussed with: CRNA, Anesthesiologist and Surgeon  Anesthesia Plan Comments:         Anesthesia Quick Evaluation

## 2015-03-24 ENCOUNTER — Encounter (HOSPITAL_BASED_OUTPATIENT_CLINIC_OR_DEPARTMENT_OTHER): Payer: Self-pay | Admitting: Urology

## 2017-01-02 IMAGING — CT CT RENAL STONE PROTOCOL
2 of 4 series · 17 of 46 positions shown, 19 images · non-contrast
Comparison: None.

CLINICAL DATA: Right flank pain, intermittent for 2 weeks and
severe for the past 2 days. Hematuria.

EXAM:
CT ABDOMEN AND PELVIS WITHOUT CONTRAST
TECHNIQUE: Multidetector CT imaging of the abdomen and pelvis was performed
following the standard protocol without IV contrast.

[Series 2: renal stone > 200 lbs 5.0 b31f · axial · 0.98mm/px · z∈[-495,-50]mm · 14 of 97 slices shown, 16 images]
[im 4/97  soft-tissue]
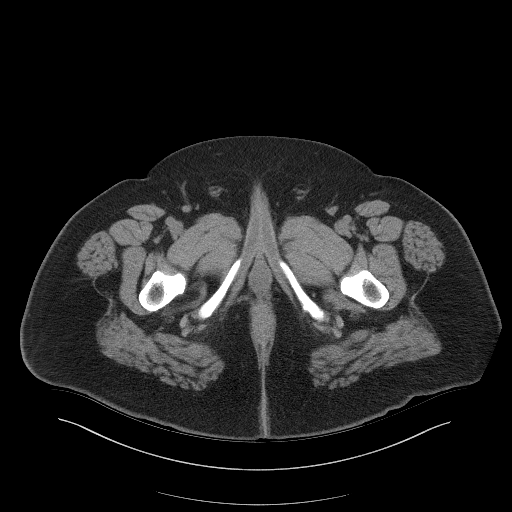
[im 4/97  bone]
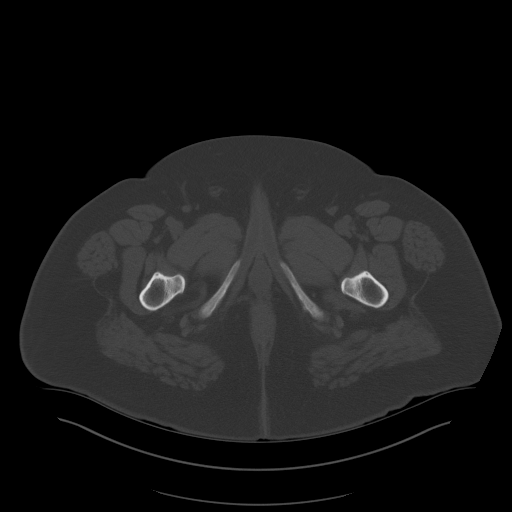
[im 12/97  soft-tissue]
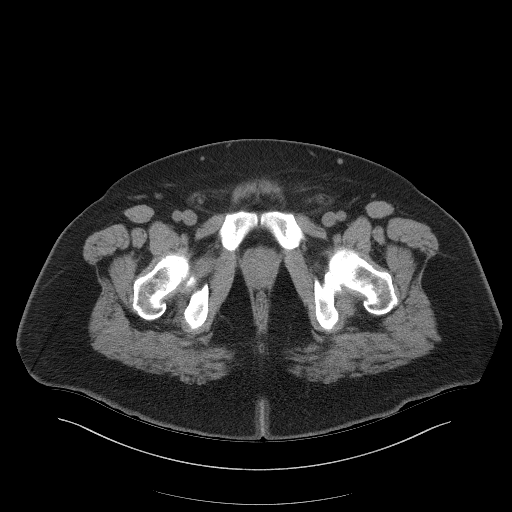
[im 20/97  soft-tissue]
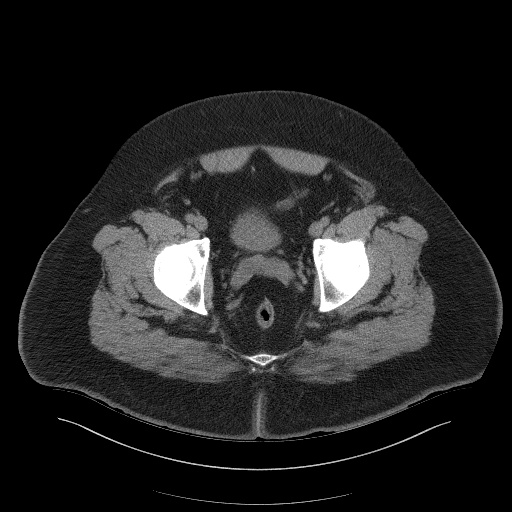
[im 27/97  soft-tissue]
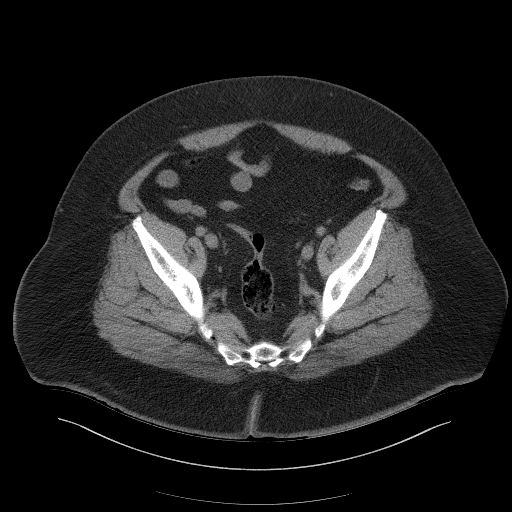
[im 31/97  soft-tissue]
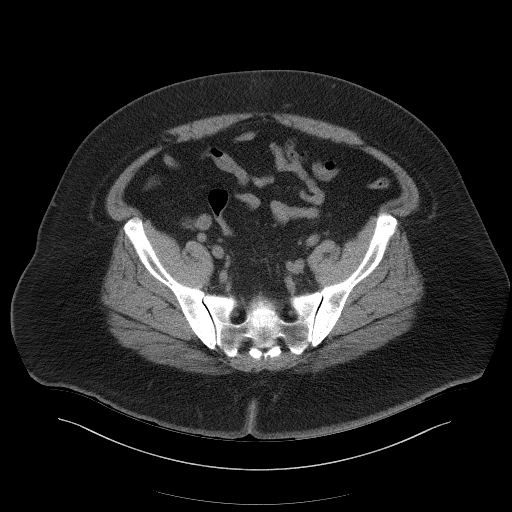
[im 39/97  soft-tissue]
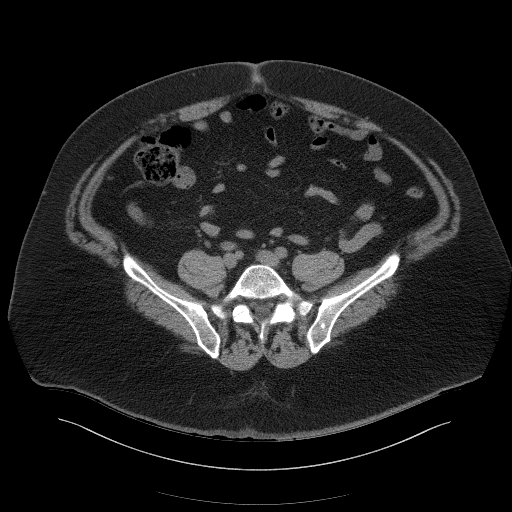
[im 47/97  soft-tissue]
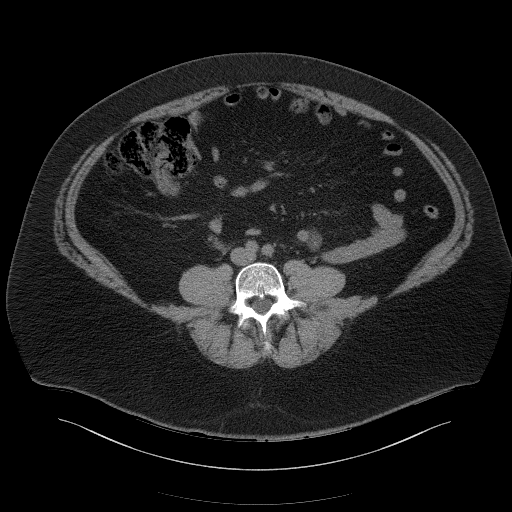
[im 50/97  soft-tissue]
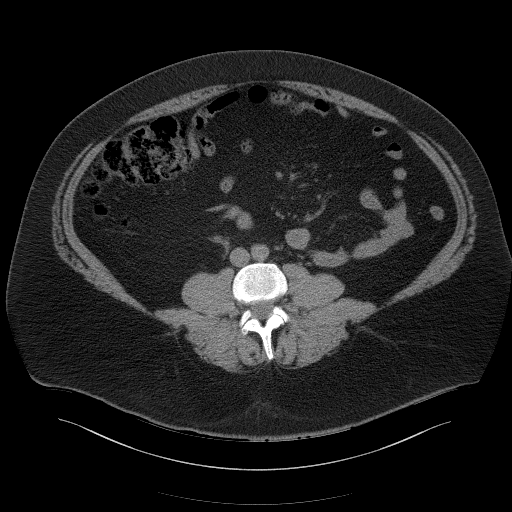
[im 58/97  soft-tissue]
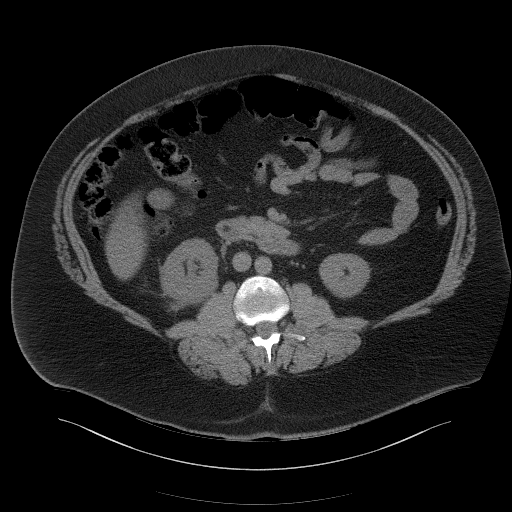
[im 58/97  bone]
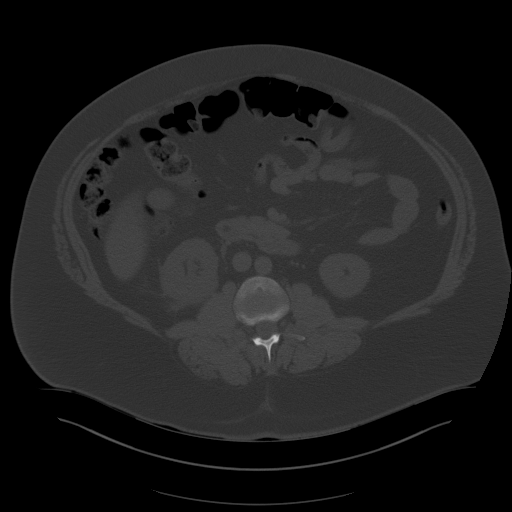
[im 66/97  soft-tissue]
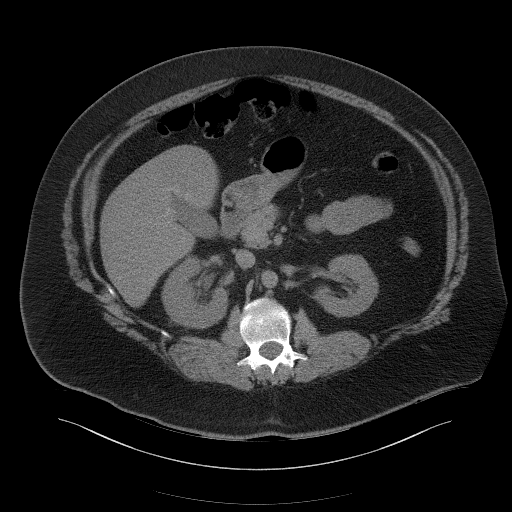
[im 73/97  soft-tissue]
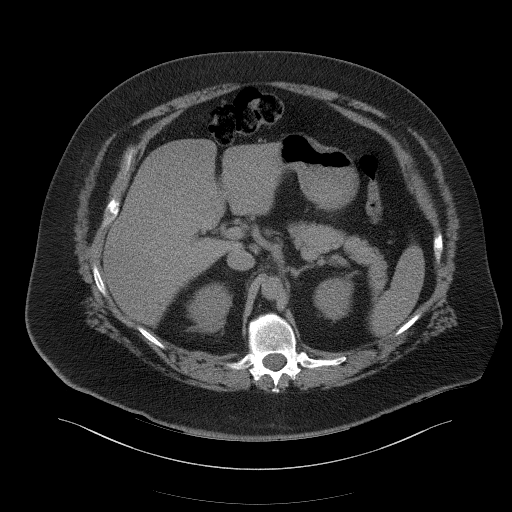
[im 77/97  soft-tissue]
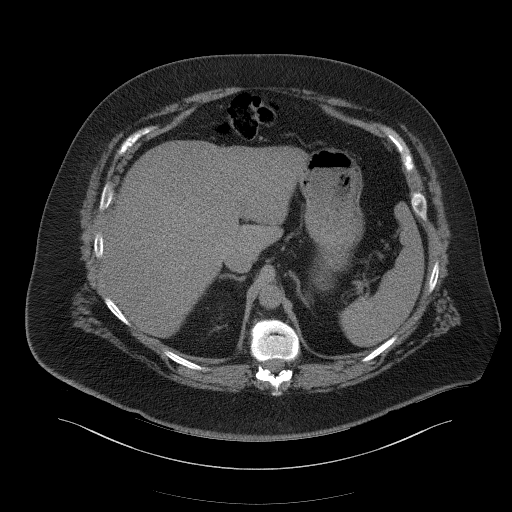
[im 85/97  soft-tissue]
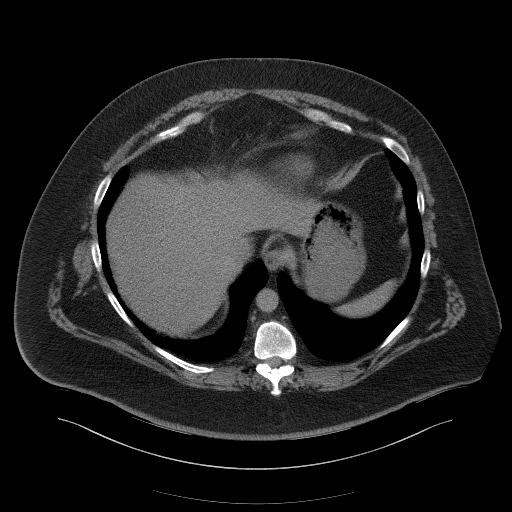
[im 93/97  soft-tissue]
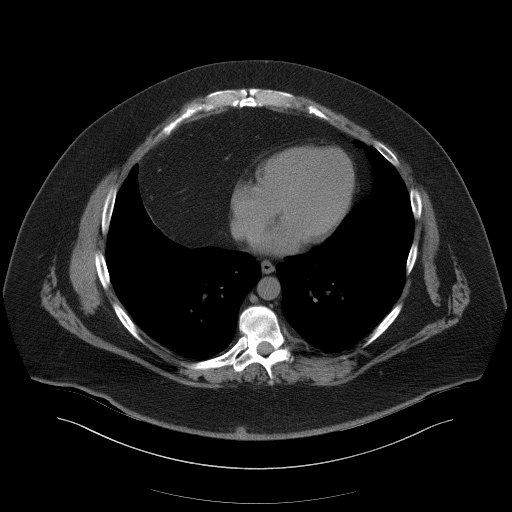

[Series 5: renal stone 3.0 coronal · coronal · 0.95mm/px · 3 of 105 slices shown]
[im 35/105  soft-tissue]
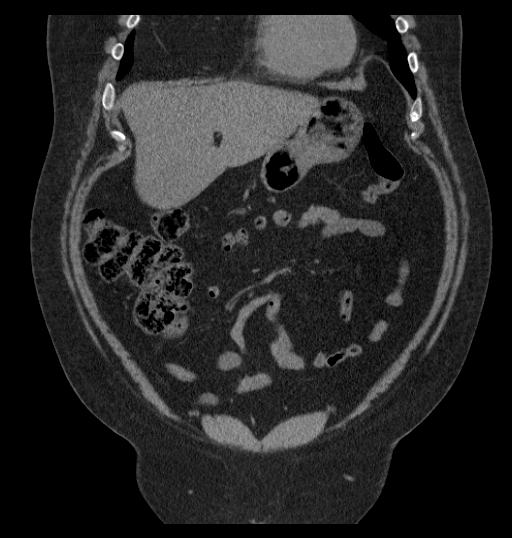
[im 47/105  soft-tissue]
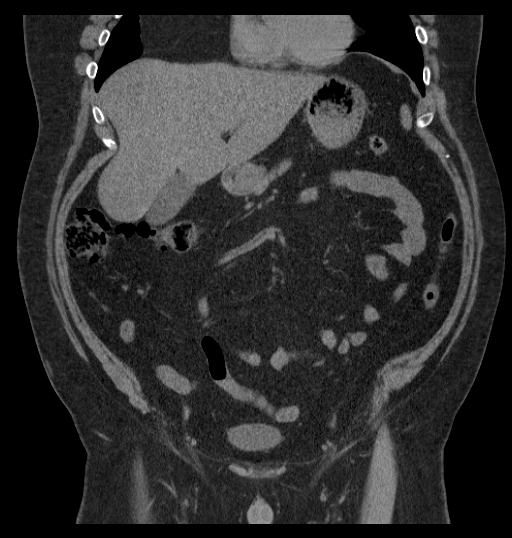
[im 58/105  soft-tissue]
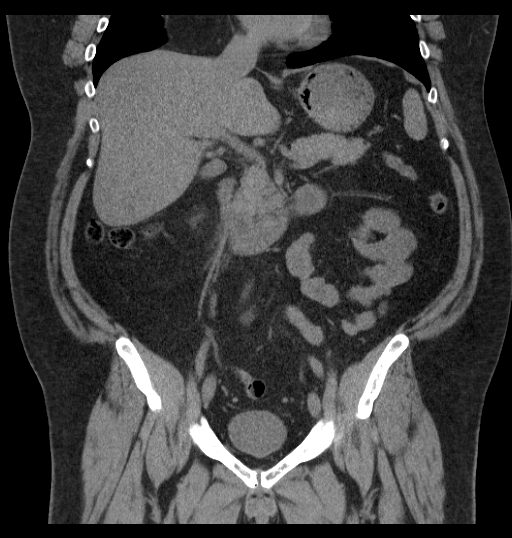

[17 of 46 positions shown; findings below may reference images not displayed]

FINDINGS: There is an obstructing 5 mm calculus in the proximal right ureter
at the low L3 level. There is moderate hydroureter and
hydronephrosis. No other urinary calculi are evident. There is no
other acute abnormality in the abdomen or pelvis.

There are unremarkable unenhanced appearances of the liver,
gallbladder, pancreas, spleen and adrenals. The abdominal aorta is
normal in caliber. There is no atherosclerotic calcification. There
is no adenopathy in the abdomen or pelvis. There are normal
appearances of the stomach, small bowel and colon. The appendix is
normal.

No significant abnormality is evident in the lower chest. No
significant musculoskeletal lesion is evident. Small fat containing
umbilical hernia is present.
IMPRESSION: 1. Obstructing 5 mm proximal right ureteral calculus at the low L3
level. Moderate hydronephrosis.
2. Small fat containing umbilical hernia.
